# Patient Record
Sex: Male | Born: 1960 | Race: White | Hispanic: No | Marital: Married | Smoking: Former smoker
Health system: Southern US, Community
[De-identification: ages and names within clinical notes are randomized; demographics above are authoritative.]

---

## 2016-01-26 ENCOUNTER — Encounter: Payer: Self-pay | Admitting: *Deleted

## 2016-01-26 ENCOUNTER — Emergency Department (INDEPENDENT_AMBULATORY_CARE_PROVIDER_SITE_OTHER): Payer: Managed Care, Other (non HMO)

## 2016-01-26 ENCOUNTER — Emergency Department (INDEPENDENT_AMBULATORY_CARE_PROVIDER_SITE_OTHER)
Admission: EM | Admit: 2016-01-26 | Discharge: 2016-01-26 | Disposition: A | Discharge status: Home / Self Care | Payer: Managed Care, Other (non HMO) | Source: Home / Self Care | Attending: Family Medicine | Admitting: Family Medicine

## 2016-01-26 DIAGNOSIS — R062 Wheezing: Secondary | ICD-10-CM

## 2016-01-26 DIAGNOSIS — J209 Acute bronchitis, unspecified: Secondary | ICD-10-CM | POA: Diagnosis not present

## 2016-01-26 DIAGNOSIS — R05 Cough: Secondary | ICD-10-CM | POA: Diagnosis not present

## 2016-01-26 MED ORDER — BENZONATATE 200 MG PO CAPS: 200.0000 mg | ORAL_CAPSULE | Freq: Every day | ORAL | Status: AC

## 2016-01-26 MED ORDER — AZITHROMYCIN 250 MG PO TABS: ORAL_TABLET | ORAL | Status: AC

## 2016-01-26 NOTE — ED Notes (Signed)
Pt c/o 5 weeks of sinus pressure and nasal congestion with cough. Sweats last night.

## 2016-01-26 NOTE — ED Provider Notes (Signed)
CSN: [id number]     Arrival date & time 01/26/16  0845 History   First MD Initiated Contact with Patient 01/26/16 [phone]     Chief Complaint  Patient presents with  . Sinus Problem      HPI Comments: Patient reports that he developed a cold-like illness about 5 weeks ago that lasted 3 weeks before resolving.  One week ago he developed similar cold-like symptoms with sinus congestion, sore throat, and cough.  Three days ago he developed wheezing.  Last night he developed sweats.  He denies pleuritic pain.  The history is provided by the patient.    History reviewed. No pertinent past medical history. History reviewed. No pertinent past surgical history. Family History  Problem Relation Age of Onset  . Dysrhythmia Mother    Social History  Substance Use Topics  . Smoking status: Former Games developermoker  . Smokeless tobacco: Never Used  . Alcohol Use: No    Review of Systems + sore throat + cough No pleuritic pain + wheezing + nasal congestion + post-nasal drainage No sinus pain/pressure No itchy/red eyes No earache No hemoptysis No SOB No fever, + chills/sweats No nausea No vomiting No abdominal pain No diarrhea No urinary symptoms No skin rash + fatigue No myalgias No headache Used OTC meds without relief  Allergies  Asa; Penicillins; Prednisone; and Tetracyclines & related  Home Medications   Prior to Admission medications   Medication Sig Start Date End Date Taking? Authorizing Provider  azithromycin (ZITHROMAX Z-PAK) 250 MG tablet Take 2 tabs today; then begin one tab once daily for 4 more days. 01/26/16   Lattie HawStephen A Syanne Looney, MD  benzonatate (TESSALON) 200 MG capsule Take 1 capsule (200 mg total) by mouth at bedtime. Take as needed for cough 01/26/16   Lattie HawStephen A Lillieann Pavlich, MD   Meds Ordered and Administered this Visit  Medications - No data to display  BP 128/86 mmHg  Pulse 87  Temp(Src) 98.6 F (37 C) (Oral)  Ht 5\' 9"  (1.753 m)  Wt 189 lb (85.73 kg)  BMI 27.90 kg/m2   SpO2 95% No data found.   Physical Exam Nursing notes and Vital Signs reviewed. Appearance:  Patient appears stated age, and in no acute distress Eyes:  Pupils are equal, round, and reactive to light and accomodation.  Extraocular movement is intact.  Conjunctivae are not inflamed  Ears:  Canals normal.  Tympanic membranes normal.  Nose:  Mildly congested turbinates.  No sinus tenderness.   Pharynx:  Normal Neck:  Supple.  Non-tender enlarged posterior/lateral nodes are palpated bilaterally  Lungs:   Bilateral posterior expiratory wheezes/rhonchi, somewhat worse on the right. Breath sounds are equal.  Moving air well. Heart:  Regular rate and rhythm without murmurs, rubs, or gallops.  Abdomen:  Nontender without masses or hepatosplenomegaly.  Bowel sounds are present.  No CVA or flank tenderness.  Extremities:  No edema.  Skin:  No rash present.   ED Course  Procedures none   Imaging Review Dg Chest 2 View  01/26/2016  CLINICAL DATA:  Cough, congestion and wheezing for 5 weeks. EXAM: CHEST  2 VIEW COMPARISON:  None. FINDINGS: Cardiomediastinal silhouette is normal. Mediastinal contours appear intact. There is no evidence of focal airspace consolidation, pleural effusion or pneumothorax. Osseous structures are without acute abnormality. Soft tissues are grossly normal. IMPRESSION: No active cardiopulmonary disease. Electronically Signed   By: Ted Mcalpineobrinka  Dimitrova M.D.   On: 01/26/2016 09:59       MDM   1. Acute  bronchitis, unspecified organism    Begin Z-pak for atypical coverage. Prescription written for Benzonatate [facility]) to take at bedtime for night-time cough.  Take plain guaifenesin (  extended release tabs such as Mucinex) twice daily, with plenty of water, for cough and congestion.  May add Pseudoephedrine ( , one or two every 4 to 6 hours) for sinus congestion.  Get adequate rest.   May use Afrin nasal spray (or generic oxymetazoline) twice daily for about 5 days  and then discontinue.  Also recommend using saline nasal spray several times daily and saline nasal irrigation (AYR is a common brand).  Use Flonase nasal spray each morning after using Afrin nasal spray and saline nasal irrigation. Try warm salt water gargles for sore throat.  Stop all antihistamines for now, and other non-prescription cough/cold preparations.  Follow-up with family doctor if not improving about 7 to10 days.     Lattie Haw, MD 01/26/16 [id number]

## 2016-01-26 NOTE — Discharge Instructions (Signed)

## 2017-11-17 IMAGING — DX DG CHEST 2V
2 series · 2 of 2 positions shown · non-contrast
Comparison: None.

CLINICAL DATA: Cough, congestion and wheezing for 5 weeks.

EXAM:
CHEST  2 VIEW

[chest pa]
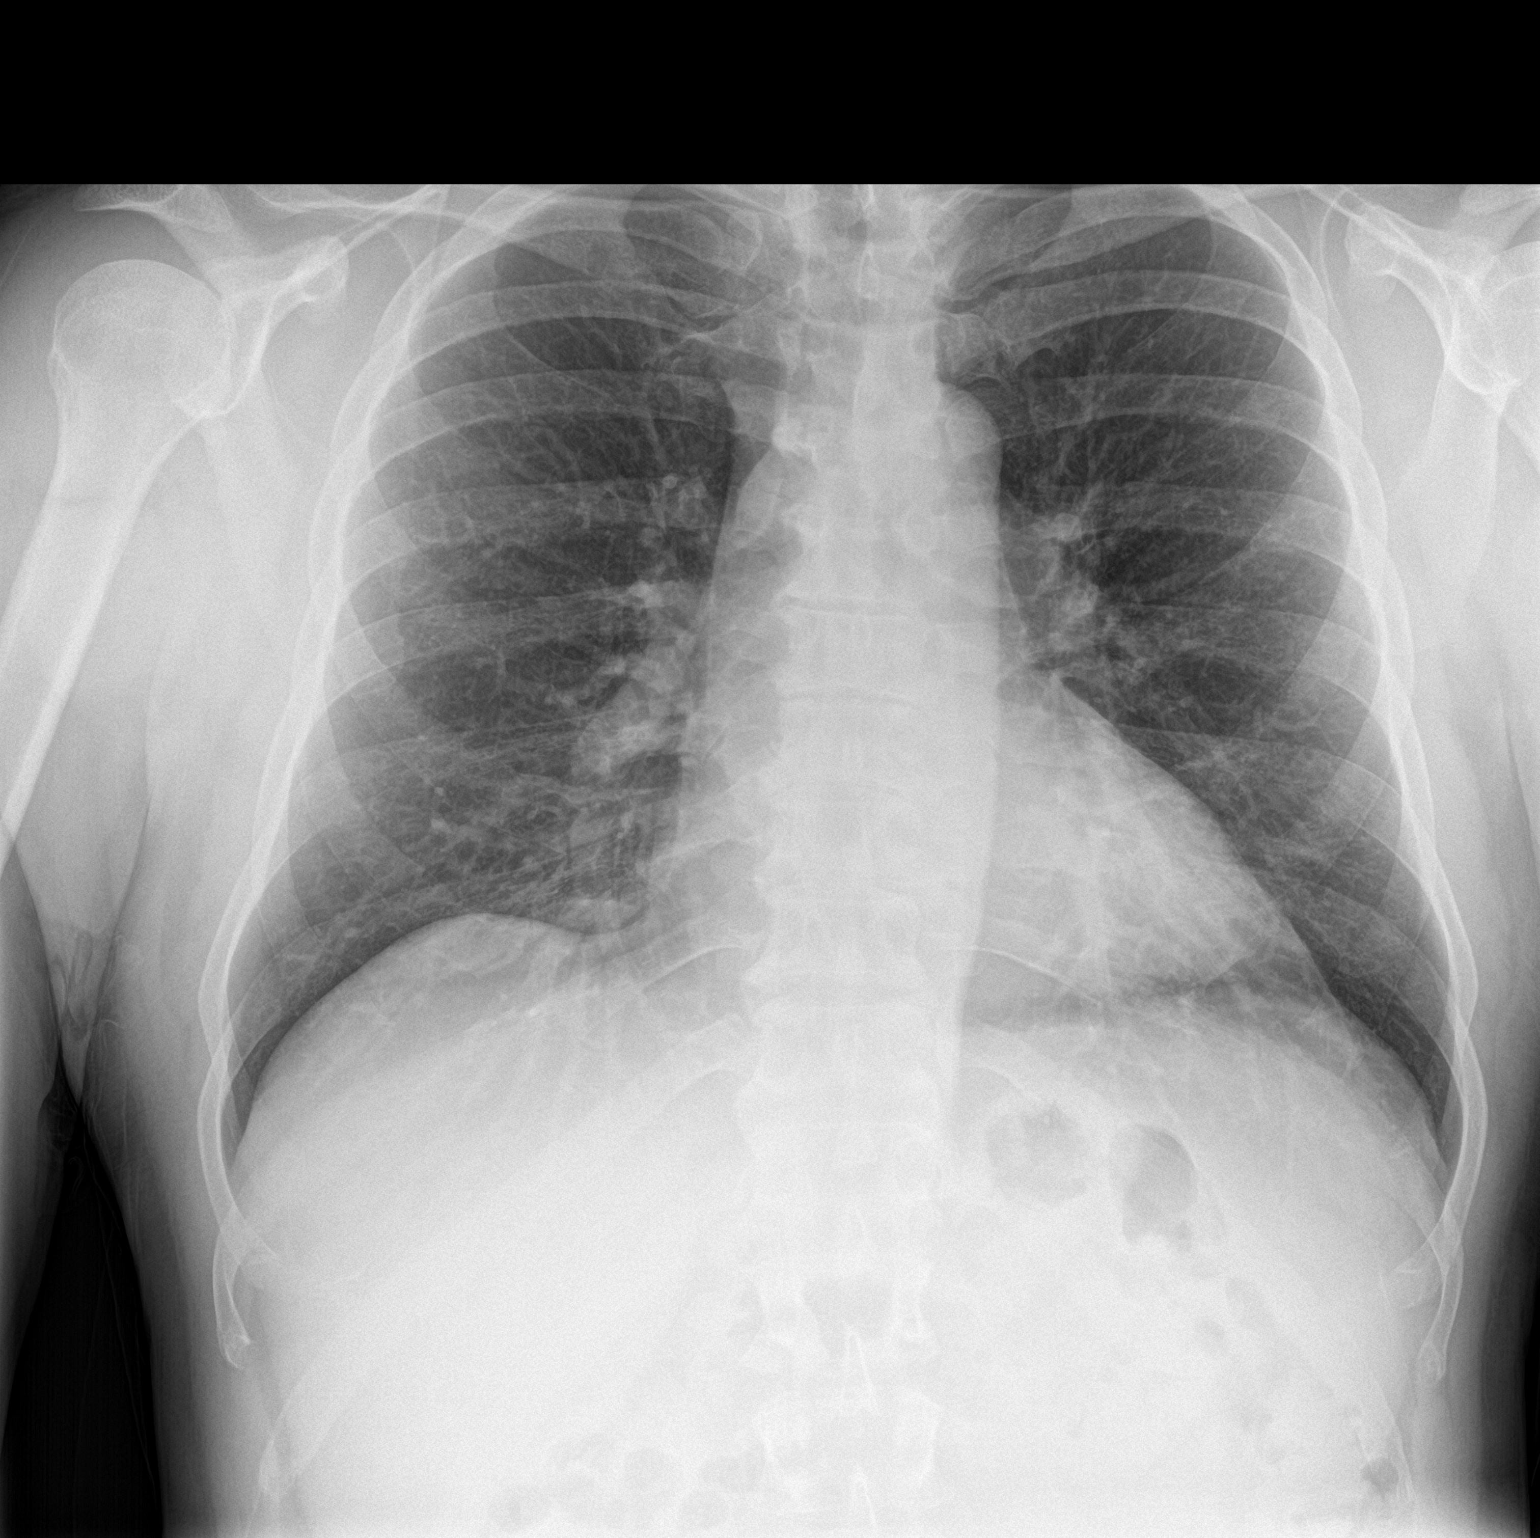

[chest lat]
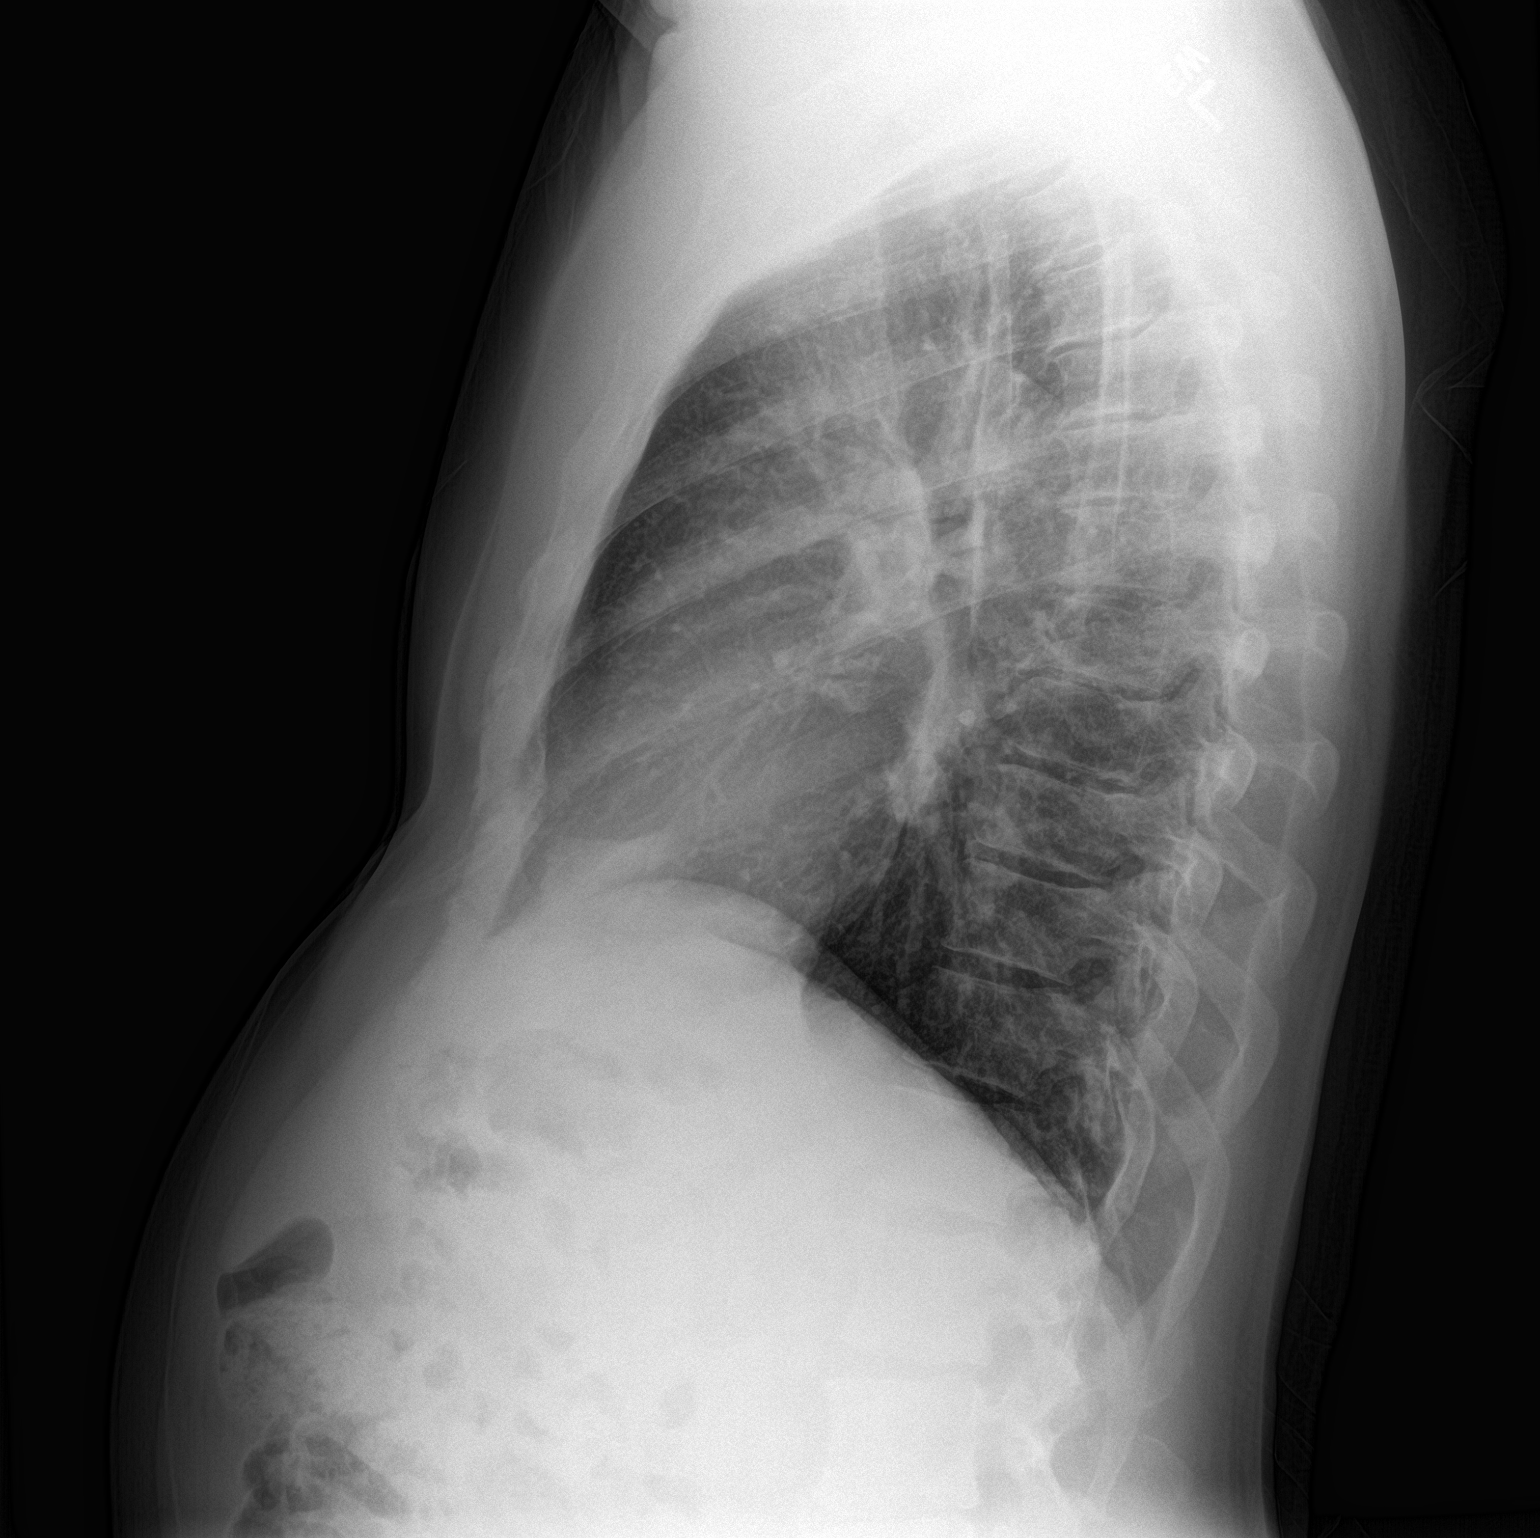

[2 of 2 positions shown; findings below may reference images not displayed]

FINDINGS: Cardiomediastinal silhouette is normal. Mediastinal contours appear
intact.

There is no evidence of focal airspace consolidation, pleural
effusion or pneumothorax.

Osseous structures are without acute abnormality. Soft tissues are
grossly normal.
IMPRESSION: No active cardiopulmonary disease.
